# Patient Record
Sex: Female | Born: 1975 | Race: White | Hispanic: No | Marital: Married | Smoking: Former smoker
Health system: Southern US, Community
[De-identification: ages and names within clinical notes are randomized; demographics above are authoritative.]

## PROBLEM LIST (undated history)

## (undated) DIAGNOSIS — K219 Gastro-esophageal reflux disease without esophagitis: Secondary | ICD-10-CM

## (undated) HISTORY — PX: WISDOM TOOTH EXTRACTION: SHX21

---

## 2006-07-28 ENCOUNTER — Ambulatory Visit: Payer: Self-pay | Admitting: Internal Medicine

## 2006-09-11 ENCOUNTER — Ambulatory Visit: Payer: Self-pay | Admitting: Internal Medicine

## 2006-09-13 ENCOUNTER — Ambulatory Visit: Payer: Self-pay | Admitting: Internal Medicine

## 2008-06-11 ENCOUNTER — Inpatient Hospital Stay (HOSPITAL_COMMUNITY): Admission: AD | Admit: 2008-06-11 | Discharge: 2008-06-15 | Payer: Self-pay | Admitting: Obstetrics and Gynecology

## 2010-11-23 NOTE — Op Note (Signed)
NAME:  Tina Sawyer, Tina Sawyer               ACCOUNT NO.:  [id number]   MEDICAL RECORD NO.:  [id number]          PATIENT TYPE:  INP   LOCATION:  9135                          FACILITY:  [facility]   PHYSICIAN:  Maxie Better, M.D.DATE OF BIRTH:  October 30, 1975   DATE OF PROCEDURE:  06/12/2008  DATE OF DISCHARGE:                               OPERATIVE REPORT   PREOPERATIVE DIAGNOSES:  1. Breech presentation with spontaneous rupture of membranes.  2. Early labor.  3. Term gestation.   PROCEDURES:  1. Primary cesarean section,  Kerr hysterotomy.   POSTOPERATIVE DIAGNOSES:  1. Incomplete breech presentation.  2. Term gestation.  3. Early labor.  4. Spontaneous rupture of membranes.   ANESTHESIA:  Spinal.   SURGEON:  Maxie Better, MD   ASSISTANT:  Marlinda Mike, CNM   PROCEDURE:  Under adequate spinal anesthesia, the patient was placed in  the supine position in a left lateral tilt.  She was sterilely prepped  and draped in usual fashion.  An indwelling Foley catheter was sterilely  placed.  Marcaine 0.25% was then injected along the planned Pfannenstiel  skin incision site.  A Pfannenstiel skin incision was then made, carried  down to the rectus fascia.  The rectus fascia was opened transversely.  The rectus fascia was then bluntly and sharply dissected off the rectus  muscle in superior and inferior fashion.  The rectus muscle was split in  midline.  The parietal peritoneum was opened bluntly and extended.  The  vesicouterine peritoneum was opened transversely and the bladder bluntly  dissected off the lower uterine segment and displaced with a bladder  retractor.  A curvilinear low-transverse uterine incision was then made  and extended with bandage scissors.  Subsequent delivery of a live female  from incomplete procedure using breech maneuvers was accomplished.  Cord  was draped around the neck and along the anterior chest.  This was  reduced after the delivery of the baby.  Baby was  bulb suctioned in the  abdomen cord was clamped and cut.  There was transferred to the awaiting  pediatrician.  Assigned Apgars of 8 and 9 at 1 and 5 minutes.  Placenta  was manually removed.  The uterine cavity was cleaned of debris.  The  uterine incision had no extension, was closed in 2 layers, the first  layer with 0 Monocryl running lock stitch, second layer imbricating  using 0 Monocryl suture.  Normal tubes and ovaries were noted  bilaterally.  The abdomen was copiously irrigated and suctioned of  debris.  The parietal peritoneum was closed with 2-0 Vicryl.  The rectus  fascia was closed with 0 Vicryl x2.  The subcutaneous areas were  irrigated.  Small bleeders cauterized.  Interrupted 2-0 plain sutures  were placed and the skin approximated with Ethicon staples.   SPECIMENS:  Placenta not sent to pathology.   ESTIMATED BLOOD LOSS:  700 mL.   INTRAOPERATIVE FLUID:  800 mL.   URINE OUTPUT:  200 mL.   COUNTS:  Sponge and instrument counts x2 was correct.   COMPLICATIONS:  None.   Weight of the baby  was 7 pounds 9 ounces.  The patient tolerated  procedure well and was transferred to recovery room in stable condition.      Maxie Better, M.D.  Electronically Signed     [place]/MEDQ  D:  06/12/2008  T:  06/13/2008  Job:  [id number]

## 2010-11-26 NOTE — Assessment & Plan Note (Signed)
[facility] OFFICE NOTE   Tina Sawyer, Tina Sawyer                        MRN:          [id number]  DATE:07/28/2006                            DOB:          July 10, 1976    CHIEF COMPLAINT:  New patient to get established, had problem with  scoliosis, back pain, urinary symptoms, question UTI.   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  She is originally from  [place] and needs a primary care physician.  She has had scoliosis,  diagnosed since about age 79, and has been followed by an orthopedist.  However, around college age she was lost to followup but had doing quite  well and needed no surgery or bracing.  However, most recently over the  last year or so she has had some difficulty with right upper back pain  in the periscapular area.  She is right-handed but has no specific  injury.  She has no neurologic symptoms or weakness.  Her pain is  problematic, but is more concerned that it needs some kind of followup.  She also has had 3-week history of UTI symptoms which she describes as  frequency, pressure without dysuria.  She has had a history of UTIs in  the past, but has not had one for quite a while.  She does take some  caffeine on a daily basis and occasional alcohol, but otherwise no  herbals or medications.  She has been on OCPs in the remote past.  She  ran out in December 2007, and because she had been on OCPs since age 68  is considering stopping OCPs for now.   PAST MEDICAL HISTORY:  See database.  UTIs.  SURGERIES:  None.  Some  history of migraine.  She is primiparous.  Last Pap in December 2006.  LMP November 15.  Tetanus shot 2005.   FAMILY HISTORY:  Positive for mother with arthritis and parents with  hypertension and diabetes.  Mom and sister may have thyroid  disease.   SOCIAL HISTORY:  Household of 2, is married, 6 to 8 hours of sleep a  night, 3 to 4 glasses of wine a week, caffeine 1 to 2 servings a day, no  current alcohol use.  Occasionally does drink emergency C powder.  Exercise, walks dog and does yoga.   REVIEW OF SYSTEMS:  Negative for chest pain, shortness of breath,  neurologic symptoms except sometimes numbness in the left lateral foot  at the toes, but no other leg symptoms.   OBJECTIVE:  Height 5 feet 2-1/2 inches, weight 111.  Pulse 72 and  regular, blood pressure 130/86.  This is a WD, WN, healthy-appearing young adult in no acute distress.  HEENT:  Generally normal.  CHEST:  CTA, BS equal.  CARDIAC:  S1, S2, no gallops or murmurs.  Peripheral pulses present.  ABDOMEN:  Soft without organomegaly, guarding or rebound.  BACK:  Does show some scoliosis of the  right upper scapular rib without  obvious motor difficulties.  NEUROLOGIC:  Grossly intact.   Urinalysis is clear.   IMPRESSION:  1. Right periscapular pain.  2. Scoliosis.  3. Urinary frequency, newer onset.  This may be noninfectious, I will      check urine culture and sensitivity, treat as appropriate.      Discussed caffeine and other bladder irritants.  We will do      referral in regard to her scoliosis and upper back pain and she      will get back to [place] as needed otherwise.  We did discuss OCPs if      needed.  She will be probably go to a GYN locally if she is      considering conception.     Neta Mends. Panosh, MD  Electronically Signed    [facility]/MedQ  DD: 07/28/2006  DT: 07/29/2006  Job #: [id number]

## 2010-11-26 NOTE — Discharge Summary (Signed)
NAME:  Tina Sawyer, Tina Sawyer               ACCOUNT NO.:  [id number]   MEDICAL RECORD NO.:  [id number]          PATIENT TYPE:  INP   LOCATION:  9135                          [facility]ACILITY:  [facility]   PHYSICIAN:  Maxie Better, M.D.DATE OF BIRTH:  03-20-1976   DATE OF ADMISSION:  06/11/2008  DATE OF DISCHARGE:  06/15/2008                               DISCHARGE SUMMARY   ADMISSION DIAGNOSES:  Early labor, spontaneous rupture of membranes, and  intrauterine gestation at 65 weeks.   DISCHARGE DIAGNOSES:  Term gestation delivered, breech presentation,  spontaneous rupture of membranes, and early labor.   PROCEDURE:  Primary cesarean section.   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  The patient desired natural childbirth.  She  was examined in the Maternity Admissions by the nurse and was found to  be 1 cm, 40-50% effaced, -2 station.  Light meconium stage fluid was  noted.  Fern positive.   HOSPITAL COURSE:  The patient was admitted to the Labor Unit and was  ambulating.  She had intermittent external fetal monitoring.  The  monitoring in the maternity admissions had shown reactivity with  contractions every 4-5 minutes.  The patient was examined at 5:15 in the  morning.  She was 3-4, 90%.  There was a question of breech presentation  with clear amniotic fluid.  Ultrasound was performed that confirmed  there was a breech presentation in light of the spontaneous rupture of  membranes and the presentation.  The patient was certainly not a  candidate for external cephalic version and was informed of the need for  primary cesarean section.  Surgical risk was reviewed.  The patient was  taken to the operating room.  She underwent a cesarean section which  resulted in delivery of 7-pound 9-ounce live female with cord around the  neck and chest.  Normal tubes and ovaries.   Apgars of 9 and 9.  The  placenta was not sent.  It was incomplete breech.  The patient had a  subsequent uncomplicated postoperative course.  She had a CBC on postop  day #1 that showed hemoglobin 11.2, hematocrit 33, white count of 10.2,  and platelet count 174,000.  By postop day #3, the patient had no  evidence of an infection, tolerating a regular diet, had some slight  bruising over the lower incision, but otherwise unremarkable.  She was  deemed well to be discharged home.   DISPOSITION:  Home.   CONDITION:  Stable.   DISCHARGE MEDICATIONS:  1. Percocet 1-2 tablets every 4-6 hours p.r.n. pain.  2. Mylicon 80 mg p.o. t.i.d. p.r.n.  3. Motrin 600-800 mg every 6-8 hours p.r.n. pain.  4. Prenatal vitamins 1 p.o.   DISCHARGE INSTRUCTIONS:  Per the postpartum booklet given.   FOLLOWUP APPOINTMENT:  At [facility] in 6 weeks.      Maxie Better, M.D.  Electronically Signed     /MEDQ  D:  07/27/2008  T:  07/27/2008  Job:  [id number]

## 2011-03-02 LAB — RPR: RPR: NONREACTIVE

## 2011-03-02 LAB — HEPATITIS B SURFACE ANTIGEN: Hepatitis B Surface Ag: NEGATIVE

## 2011-03-02 LAB — ANTIBODY SCREEN: Antibody Screen: NEGATIVE

## 2011-04-15 LAB — CBC
HCT: 33 % — ABNORMAL LOW (ref 36.0–46.0)
HCT: 38.7 % (ref 36.0–46.0)
Hemoglobin: 13 g/dL (ref 12.0–15.0)
MCHC: 33.7 g/dL (ref 30.0–36.0)
MCHC: 34 g/dL (ref 30.0–36.0)
MCV: 94.8 fL (ref 78.0–100.0)
Platelets: 174 10*3/uL (ref 150–400)
RBC: 4.13 MIL/uL (ref 3.87–5.11)
RDW: 13.1 % (ref 11.5–15.5)
WBC: 10.2 10*3/uL (ref 4.0–10.5)

## 2011-05-15 ENCOUNTER — Encounter (HOSPITAL_COMMUNITY): Payer: Self-pay

## 2011-05-16 ENCOUNTER — Other Ambulatory Visit: Payer: Self-pay | Admitting: Obstetrics & Gynecology

## 2011-05-17 ENCOUNTER — Encounter (HOSPITAL_COMMUNITY): Payer: Self-pay | Admitting: *Deleted

## 2011-05-18 ENCOUNTER — Encounter (HOSPITAL_COMMUNITY)
Admission: RE | Admit: 2011-05-18 | Discharge: 2011-05-18 | Disposition: A | Discharge status: Home / Self Care | Payer: BC Managed Care – PPO | Source: Ambulatory Visit | Attending: Obstetrics & Gynecology | Admitting: Obstetrics & Gynecology

## 2011-05-18 ENCOUNTER — Encounter (HOSPITAL_COMMUNITY): Payer: Self-pay

## 2011-05-18 HISTORY — DX: Gastro-esophageal reflux disease without esophagitis: K21.9

## 2011-05-18 LAB — CBC
HCT: 39.2 % (ref 36.0–46.0)
Hemoglobin: 12.9 g/dL (ref 12.0–15.0)
RBC: 4.19 MIL/uL (ref 3.87–5.11)

## 2011-05-18 LAB — RPR: RPR Ser Ql: NONREACTIVE

## 2011-05-18 LAB — SURGICAL PCR SCREEN: MRSA, PCR: NEGATIVE

## 2011-05-18 NOTE — Patient Instructions (Addendum)
   Your procedure is scheduled XL:[id number] November 9th  Enter through the Main Entrance of [facility] at:7:30am Pick up the phone at the desk and dial [phone] and inform us of your arrival.  Please call this number if you have any problems the morning of surgery: [phone]  Remember: Do not eat food after midnight:Thursday Do not drink clear liquids after:midnight thursday Take these medicines the morning of surgery with a SIP OF WATER:none  Do not wear jewelry, make-up, or FINGER nail polish Do not wear lotions, powders, or perfumes.  You may not  wear deodorant. Do not shave 48 hours prior to surgery. Do not bring valuables to the hospital.  Leave suitcase in the car. After Surgery it may be brought to your room. For patients being admitted to the hospital, checkout time is 11:00am the day of discharge.   Remember to use your hibiclens as instructed.Please shower with 1/2 bottle the evening before your surgery and the other 1/2 bottle the morning of surgery.

## 2011-05-19 MED ORDER — GENTAMICIN SULFATE 40 MG/ML IJ SOLN
Freq: Once | INTRAMUSCULAR | Status: DC
  Filled 2011-05-19: qty 2.5

## 2011-05-19 MED ORDER — GENTAMICIN SULFATE 40 MG/ML IJ SOLN: INTRAMUSCULAR | Status: DC

## 2011-05-20 ENCOUNTER — Encounter (HOSPITAL_COMMUNITY): Payer: Self-pay | Admitting: Cardiology

## 2011-05-20 ENCOUNTER — Inpatient Hospital Stay (HOSPITAL_COMMUNITY)
Admission: AD | Admit: 2011-05-20 | Discharge: 2011-05-22 | DRG: 371 | Disposition: A | Discharge status: Home / Self Care | Payer: BC Managed Care – PPO | Source: Ambulatory Visit | Attending: Obstetrics & Gynecology | Admitting: Obstetrics & Gynecology

## 2011-05-20 ENCOUNTER — Encounter (HOSPITAL_COMMUNITY): Payer: Self-pay | Admitting: Anesthesiology

## 2011-05-20 ENCOUNTER — Inpatient Hospital Stay (HOSPITAL_COMMUNITY): Payer: BC Managed Care – PPO | Admitting: Anesthesiology

## 2011-05-20 ENCOUNTER — Encounter (HOSPITAL_COMMUNITY): Payer: Self-pay

## 2011-05-20 ENCOUNTER — Encounter (HOSPITAL_COMMUNITY)
Admission: AD | Disposition: A | Discharge status: Home / Self Care | Payer: Self-pay | Source: Ambulatory Visit | Attending: Obstetrics & Gynecology

## 2011-05-20 DIAGNOSIS — Z01818 Encounter for other preprocedural examination: Secondary | ICD-10-CM

## 2011-05-20 DIAGNOSIS — O34219 Maternal care for unspecified type scar from previous cesarean delivery: Principal | ICD-10-CM | POA: Diagnosis present

## 2011-05-20 DIAGNOSIS — Z01812 Encounter for preprocedural laboratory examination: Secondary | ICD-10-CM

## 2011-05-20 LAB — ABO/RH: ABO/RH(D): A POS

## 2011-05-20 SURGERY — Surgical Case
Anesthesia: Spinal

## 2011-05-20 MED ORDER — ONDANSETRON HCL 4 MG/2ML IJ SOLN
INTRAMUSCULAR | Status: DC | PRN
  Administered 2011-05-20: 4 mg via INTRAVENOUS

## 2011-05-20 MED ORDER — METOCLOPRAMIDE HCL 5 MG/ML IJ SOLN: 10.0000 mg | Freq: Three times a day (TID) | INTRAMUSCULAR | Status: DC | PRN

## 2011-05-20 MED ORDER — SODIUM CHLORIDE 0.9 % IJ SOLN: 3.0000 mL | INTRAMUSCULAR | Status: DC | PRN

## 2011-05-20 MED ORDER — LACTATED RINGERS IV SOLN
INTRAVENOUS | Status: DC | PRN
  Administered 2011-05-20 (×4): via INTRAVENOUS

## 2011-05-20 MED ORDER — SCOPOLAMINE 1 MG/3DAYS TD PT72
MEDICATED_PATCH | TRANSDERMAL | Status: AC
  Administered 2011-05-20: 1.5 mg via TRANSDERMAL
  Filled 2011-05-20: qty 1

## 2011-05-20 MED ORDER — DIPHENHYDRAMINE HCL 50 MG/ML IJ SOLN: 12.5000 mg | INTRAMUSCULAR | Status: DC | PRN

## 2011-05-20 MED ORDER — EPHEDRINE SULFATE 50 MG/ML IJ SOLN
INTRAMUSCULAR | Status: DC | PRN
  Administered 2011-05-20 (×2): 10 mg via INTRAVENOUS
  Administered 2011-05-20 (×2): 5 mg via INTRAVENOUS

## 2011-05-20 MED ORDER — GENTAMICIN SULFATE 40 MG/ML IJ SOLN
INTRAVENOUS | Status: DC | PRN
  Administered 2011-05-20: 100 mL via INTRAVENOUS

## 2011-05-20 MED ORDER — HYDROMORPHONE HCL PF 1 MG/ML IJ SOLN: 0.2500 mg | INTRAMUSCULAR | Status: DC | PRN

## 2011-05-20 MED ORDER — SCOPOLAMINE 1 MG/3DAYS TD PT72
1.0000 | MEDICATED_PATCH | Freq: Once | TRANSDERMAL | Status: DC
  Filled 2011-05-20: qty 1

## 2011-05-20 MED ORDER — IBUPROFEN 600 MG PO TABS
600.0000 mg | ORAL_TABLET | Freq: Four times a day (QID) | ORAL | Status: DC
  Administered 2011-05-21 – 2011-05-22 (×5): 600 mg via ORAL
  Filled 2011-05-20 (×4): qty 1

## 2011-05-20 MED ORDER — PRENATAL PLUS 27-1 MG PO TABS
1.0000 | ORAL_TABLET | Freq: Every day | ORAL | Status: DC
  Administered 2011-05-21 – 2011-05-22 (×2): 1 via ORAL
  Filled 2011-05-20 (×2): qty 1

## 2011-05-20 MED ORDER — SIMETHICONE 80 MG PO CHEW
80.0000 mg | CHEWABLE_TABLET | Freq: Three times a day (TID) | ORAL | Status: DC
  Administered 2011-05-20 – 2011-05-22 (×5): 80 mg via ORAL

## 2011-05-20 MED ORDER — FENTANYL CITRATE 0.05 MG/ML IJ SOLN: 25.0000 ug | INTRAMUSCULAR | Status: DC | PRN

## 2011-05-20 MED ORDER — OXYTOCIN 20 UNITS IN LACTATED RINGERS INFUSION - SIMPLE: 125.0000 mL/h | INTRAVENOUS | Status: AC

## 2011-05-20 MED ORDER — NALBUPHINE HCL 10 MG/ML IJ SOLN
5.0000 mg | INTRAMUSCULAR | Status: DC | PRN
  Filled 2011-05-20: qty 1

## 2011-05-20 MED ORDER — SIMETHICONE 80 MG PO CHEW: 80.0000 mg | CHEWABLE_TABLET | ORAL | Status: DC | PRN

## 2011-05-20 MED ORDER — IBUPROFEN 600 MG PO TABS: 600.0000 mg | ORAL_TABLET | Freq: Four times a day (QID) | ORAL | Status: DC | PRN

## 2011-05-20 MED ORDER — MORPHINE SULFATE (PF) 0.5 MG/ML IJ SOLN
INTRAMUSCULAR | Status: DC | PRN
  Administered 2011-05-20: .1 mg via INTRATHECAL

## 2011-05-20 MED ORDER — SODIUM CHLORIDE 0.9 % IV SOLN
1.0000 ug/kg/h | INTRAVENOUS | Status: DC | PRN
  Filled 2011-05-20: qty 2.5

## 2011-05-20 MED ORDER — MEPERIDINE HCL 25 MG/ML IJ SOLN: 6.2500 mg | INTRAMUSCULAR | Status: DC | PRN

## 2011-05-20 MED ORDER — NALOXONE HCL 0.4 MG/ML IJ SOLN: 0.4000 mg | INTRAMUSCULAR | Status: DC | PRN

## 2011-05-20 MED ORDER — BUPIVACAINE IN DEXTROSE 0.75-8.25 % IT SOLN
INTRATHECAL | Status: DC | PRN
  Administered 2011-05-20: 11.75 mg via INTRATHECAL

## 2011-05-20 MED ORDER — DIPHENHYDRAMINE HCL 25 MG PO CAPS: 25.0000 mg | ORAL_CAPSULE | ORAL | Status: DC | PRN

## 2011-05-20 MED ORDER — ONDANSETRON HCL 4 MG/2ML IJ SOLN: 4.0000 mg | INTRAMUSCULAR | Status: DC | PRN

## 2011-05-20 MED ORDER — TETANUS-DIPHTH-ACELL PERTUSSIS 5-2.5-18.5 LF-MCG/0.5 IM SUSP: 0.5000 mL | Freq: Once | INTRAMUSCULAR | Status: DC

## 2011-05-20 MED ORDER — HYDROMORPHONE HCL PF 1 MG/ML IJ SOLN
INTRAMUSCULAR | Status: AC
  Filled 2011-05-20: qty 1

## 2011-05-20 MED ORDER — HYDROMORPHONE HCL PF 1 MG/ML IJ SOLN
0.2500 mg | INTRAMUSCULAR | Status: DC | PRN
  Administered 2011-05-20: 0.25 mg via INTRAVENOUS

## 2011-05-20 MED ORDER — LACTATED RINGERS IV SOLN
INTRAVENOUS | Status: DC
  Administered 2011-05-20: 22:00:00 via INTRAVENOUS

## 2011-05-20 MED ORDER — LANOLIN HYDROUS EX OINT: 1.0000 "application " | TOPICAL_OINTMENT | CUTANEOUS | Status: DC | PRN

## 2011-05-20 MED ORDER — OXYTOCIN 20 UNITS IN LACTATED RINGERS INFUSION - SIMPLE
INTRAVENOUS | Status: DC | PRN
  Administered 2011-05-20 (×2): 20 [IU] via INTRAVENOUS

## 2011-05-20 MED ORDER — DIBUCAINE 1 % RE OINT: 1.0000 "application " | TOPICAL_OINTMENT | RECTAL | Status: DC | PRN

## 2011-05-20 MED ORDER — PHENYLEPHRINE HCL 10 MG/ML IJ SOLN
INTRAMUSCULAR | Status: DC | PRN
  Administered 2011-05-20 (×4): 40 ug via INTRAVENOUS

## 2011-05-20 MED ORDER — SCOPOLAMINE 1 MG/3DAYS TD PT72
1.0000 | MEDICATED_PATCH | Freq: Once | TRANSDERMAL | Status: DC
  Administered 2011-05-20: 1.5 mg via TRANSDERMAL

## 2011-05-20 MED ORDER — KETOROLAC TROMETHAMINE 60 MG/2ML IM SOLN
60.0000 mg | Freq: Once | INTRAMUSCULAR | Status: AC | PRN
  Administered 2011-05-20: 60 mg via INTRAMUSCULAR

## 2011-05-20 MED ORDER — DIPHENHYDRAMINE HCL 50 MG/ML IJ SOLN: 25.0000 mg | INTRAMUSCULAR | Status: DC | PRN

## 2011-05-20 MED ORDER — WITCH HAZEL-GLYCERIN EX PADS: 1.0000 "application " | MEDICATED_PAD | CUTANEOUS | Status: DC | PRN

## 2011-05-20 MED ORDER — ONDANSETRON HCL 4 MG/2ML IJ SOLN: 4.0000 mg | Freq: Three times a day (TID) | INTRAMUSCULAR | Status: DC | PRN

## 2011-05-20 MED ORDER — DIPHENHYDRAMINE HCL 25 MG PO CAPS: 25.0000 mg | ORAL_CAPSULE | Freq: Four times a day (QID) | ORAL | Status: DC | PRN

## 2011-05-20 MED ORDER — MENTHOL 3 MG MT LOZG: 1.0000 | LOZENGE | OROMUCOSAL | Status: DC | PRN

## 2011-05-20 MED ORDER — LACTATED RINGERS IV SOLN
INTRAVENOUS | Status: DC
  Administered 2011-05-20: 08:00:00 via INTRAVENOUS

## 2011-05-20 MED ORDER — OXYCODONE-ACETAMINOPHEN 5-325 MG PO TABS
1.0000 | ORAL_TABLET | ORAL | Status: DC | PRN
  Administered 2011-05-21: 1 via ORAL
  Administered 2011-05-21: 2 via ORAL
  Administered 2011-05-21 – 2011-05-22 (×6): 1 via ORAL
  Filled 2011-05-20: qty 2
  Filled 2011-05-20 (×5): qty 1
  Filled 2011-05-20: qty 2
  Filled 2011-05-20: qty 1

## 2011-05-20 MED ORDER — MEPERIDINE HCL 25 MG/ML IJ SOLN
6.2500 mg | INTRAMUSCULAR | Status: DC | PRN
Start: 2011-05-20 — End: 2011-05-20

## 2011-05-20 MED ORDER — FENTANYL CITRATE 0.05 MG/ML IJ SOLN
INTRAMUSCULAR | Status: DC | PRN
  Administered 2011-05-20: 15 ug via INTRATHECAL

## 2011-05-20 MED ORDER — ONDANSETRON HCL 4 MG PO TABS: 4.0000 mg | ORAL_TABLET | ORAL | Status: DC | PRN

## 2011-05-20 MED ORDER — KETOROLAC TROMETHAMINE 30 MG/ML IJ SOLN: 30.0000 mg | Freq: Four times a day (QID) | INTRAMUSCULAR | Status: AC | PRN

## 2011-05-20 MED ORDER — SENNOSIDES-DOCUSATE SODIUM 8.6-50 MG PO TABS
2.0000 | ORAL_TABLET | Freq: Every day | ORAL | Status: DC
  Administered 2011-05-20 – 2011-05-21 (×2): 2 via ORAL

## 2011-05-20 MED ORDER — ZOLPIDEM TARTRATE 5 MG PO TABS: 5.0000 mg | ORAL_TABLET | Freq: Every evening | ORAL | Status: DC | PRN

## 2011-05-20 MED ORDER — KETOROLAC TROMETHAMINE 30 MG/ML IJ SOLN
30.0000 mg | Freq: Four times a day (QID) | INTRAMUSCULAR | Status: AC | PRN
  Administered 2011-05-20 – 2011-05-21 (×3): 30 mg via INTRAVENOUS
  Filled 2011-05-20 (×3): qty 1

## 2011-05-20 SURGICAL SUPPLY — 40 items
BENZOIN TINCTURE PRP APPL 2/3 (GAUZE/BANDAGES/DRESSINGS) ×2 IMPLANT
CHLORAPREP W/TINT 26ML (MISCELLANEOUS) ×2 IMPLANT
CLOTH BEACON ORANGE TIMEOUT ST (SAFETY) ×2 IMPLANT
CONTAINER PREFILL 10% NBF 15ML (MISCELLANEOUS) IMPLANT
DRESSING TELFA 8X3 (GAUZE/BANDAGES/DRESSINGS) ×6 IMPLANT
DRSG PAD ABDOMINAL 8X10 ST (GAUZE/BANDAGES/DRESSINGS) ×2 IMPLANT
ELECT REM PT RETURN 9FT ADLT (ELECTROSURGICAL) ×2
ELECTRODE REM PT RTRN 9FT ADLT (ELECTROSURGICAL) ×1 IMPLANT
EXTRACTOR VACUUM KIWI (MISCELLANEOUS) IMPLANT
EXTRACTOR VACUUM M CUP 4 TUBE (SUCTIONS) IMPLANT
GAUZE SPONGE 4X4 12PLY STRL LF (GAUZE/BANDAGES/DRESSINGS) ×4 IMPLANT
GLOVE BIO SURGEON STRL SZ7 (GLOVE) ×2 IMPLANT
GLOVE BIOGEL PI IND STRL 7.0 (GLOVE) ×2 IMPLANT
GLOVE BIOGEL PI INDICATOR 7.0 (GLOVE) ×2
GOWN PREVENTION PLUS LG XLONG (DISPOSABLE) ×6 IMPLANT
KIT ABG SYR 3ML LUER SLIP (SYRINGE) IMPLANT
NEEDLE HYPO 25X5/8 SAFETYGLIDE (NEEDLE) IMPLANT
NS IRRIG 1000ML POUR BTL (IV SOLUTION) ×2 IMPLANT
PACK C SECTION WH (CUSTOM PROCEDURE TRAY) ×2 IMPLANT
PAD ABD 7.5X8 STRL (GAUZE/BANDAGES/DRESSINGS) ×4 IMPLANT
RTRCTR C-SECT PINK 25CM LRG (MISCELLANEOUS) IMPLANT
SLEEVE SCD COMPRESS KNEE MED (MISCELLANEOUS) IMPLANT
SPONGE GAUZE 4X4 12PLY (GAUZE/BANDAGES/DRESSINGS) ×2 IMPLANT
STAPLER VISISTAT 35W (STAPLE) IMPLANT
STRIP CLOSURE SKIN 1/2X4 (GAUZE/BANDAGES/DRESSINGS) ×4 IMPLANT
STRIP CLOSURE SKIN 1/4X4 (GAUZE/BANDAGES/DRESSINGS) IMPLANT
SUT MNCRL 0 VIOLET CTX 36 (SUTURE) ×3 IMPLANT
SUT MONOCRYL 0 CTX 36 (SUTURE) ×3
SUT PLAIN 0 NONE (SUTURE) IMPLANT
SUT PLAIN 2 0 (SUTURE)
SUT PLAIN ABS 2-0 CT1 27XMFL (SUTURE) IMPLANT
SUT VIC AB 0 CT1 27 (SUTURE) ×2
SUT VIC AB 0 CT1 27XBRD ANBCTR (SUTURE) ×2 IMPLANT
SUT VIC AB 2-0 CT1 27 (SUTURE) ×2
SUT VIC AB 2-0 CT1 TAPERPNT 27 (SUTURE) ×2 IMPLANT
SUT VIC AB 4-0 KS 27 (SUTURE) ×2 IMPLANT
SUT VICRYL 0 TIES 12 18 (SUTURE) IMPLANT
TOWEL OR 17X24 6PK STRL BLUE (TOWEL DISPOSABLE) ×4 IMPLANT
TRAY FOLEY CATH 14FR (SET/KITS/TRAYS/PACK) IMPLANT
WATER STERILE IRR 1000ML POUR (IV SOLUTION) ×2 IMPLANT

## 2011-05-20 NOTE — Transfer of Care (Signed)
Immediate Anesthesia Transfer of Care Note  Patient: Tina Sawyer  Procedure(s) Performed:  CESAREAN SECTION - Repeat  Patient Location: PACU  Anesthesia Type: Spinal  Level of Consciousness: awake, alert  and oriented  Airway & Oxygen Therapy: Patient Spontanous Breathing  Post-op Assessment: Report given to PACU RN and Post -op Vital signs reviewed and stable  Post vital signs: Reviewed and stable  Complications: No apparent anesthesia complications

## 2011-05-20 NOTE — H&P (Signed)
Jeananne Bedwell is a 35 y.o. female presenting for repeat cesarean section. No complaints. Healthy, uncomplicated pregnancy. Nl labs and sono. [facility] [place] Marlinda Mike CNM.  Allergy - Amox  History OB History    Grav Para Term Preterm Abortions TAB SAB Ect Mult Living   3 1 1  1  1   1      Past Medical History  Diagnosis Date  . Asthma     as child-exercise induced  . GERD (gastroesophageal reflux disease)     with pregnancy-no meds   Past Surgical History  Procedure Date  . Cesarean section 2009  . Wisdom tooth extraction    Family History: family history is not on file. Social History:  reports that she quit smoking about 10 years ago. She does not have any smokeless tobacco history on file. She reports that she drinks alcohol. She reports that she does not use illicit drugs.  ROS negative all.     Blood pressure 117/65, pulse 78, temperature 98.4 F (36.9 C), temperature source Oral, resp. rate 16, SpO2 98.00%. Exam Physical Exam  A&O x 3, no acute distress. Pleasant HEENT neg, no thyromegaly Lungs CTA bilat CV RRR, S1S2 normal Abdo soft, non tender, non acute Extr no edema/ tenderness Pelvic derered FHT  140s.  Toco no palpable contractions  Prenatal labs: ABO, Rh: A/Positive/-- (08/22 0000) Antibody: Negative (08/22 0000) Rubella:  Immune RPR: NON REACTIVE (11/07 0908)  HBsAg: Negative (08/22 0000)  HIV: Non-reactive (08/22 0000)  GBS:   Negative.  1 hr abn, 3 hr GTT normal.  Ob sono - normal.   Assessment/Plan: 39.2/7 wks, prior LTCS, here for repeat cesarean section. Risks/complications reviewed, informed written consent. Agrees.     Dasan Hardman R 05/20/2011, 8:46 AM

## 2011-05-20 NOTE — Anesthesia Procedure Notes (Signed)
Spinal  Patient location during procedure: OR Start time: 05/20/2011 9:10 AM Staffing Performed by: anesthesiologist  Preanesthetic Checklist Completed: patient identified, site marked, surgical consent, pre-op evaluation, timeout performed, IV checked, risks and benefits discussed and monitors and equipment checked Spinal Block Patient position: sitting Prep: site prepped and draped and DuraPrep Patient monitoring: heart rate, cardiac monitor, continuous pulse ox and blood pressure Approach: midline Location: L3-4 Injection technique: single-shot Needle Needle type: Sprotte  Needle gauge: 24 G Needle length: 9 cm Assessment Sensory level: T4 Additional Notes Clear free flow CSF on first attempt.  Significant scoliosis noted.  Jasmine December, MD

## 2011-05-20 NOTE — Op Note (Signed)
Charlott Rakes  Repeat Cesarean Section Procedure Note   05/20/2011  Pre-operative Diagnosis: Previous C-Section, desired elective repeat cesarean section, 39.2/7 wks.  Post-operative Diagnosis: Same Surgeon: Surgeon(s) and Role:  Robley Fries - Primary  Assistants: Marlinda Mike, CNM Anesthesia: Spinal    Procedure Details:  The patient was seen in the Holding Room. The risks, benefits, complications, treatment options, and expected outcomes were discussed with the patient. The patient concurred with the proposed plan, giving informed consent. identified as Charlott Rakes and the procedure verified as Repeat Cesarean Section Delivery. She was brought to OR with IV running and received Gentamicin and Clindamycin for prophylaxis.  A Time Out was held and the above information confirmed. Spinal anesthesia was adequate. Patient was prepped, draped after inserting foley in sterile fashion. A Pfannenstiel incision was made and carried down through the subcutaneous tissue to the fascia. Fascial incision was made and extended transversely. The fascia was separated from the underlying rectus tissue superiorly and inferiorly. The peritoneum was identified and entered. Peritoneal incision was extended longitudinally. The utero-vesical peritoneal reflection was incised transversely and the bladder flap was bluntly freed from the lower uterine segment. A low transverse uterine incision was made. Delivered from Cephalic presentation was a viable Female infant, weight 7 lb 2.8 oz at 9.29 am, with Apgar scores of 9 and 9. Nuchal cord x 2 noted, was reduced after head delivery. Cord clamped and cut, baby handed to NICU team. Cord blood donation desired. Cord gas not needed. Placenta was removed Intact and appeared normal. The uterine outline, tubes and ovaries appeared normal}. The uterine incision was closed in 2 layers with running locked sutures of 0Vicryl.   Hemostasis was observed. Lavage was carried out until  clear. Peritoneum closed with 2-0 vicryl.  The fascia was then reapproximated with running sutures of 0Vicryl. The subcuticular closure was performed using 3-0Vicryl. Steristrips and pressure dressing applied.  Instrument, sponge, and needle counts were correct prior the abdominal closure and were correct at the conclusion of the case.   Findings: Female infant, born at 9.29 am, cephalic delivery. Nuchal cord x 2, loose. Apgars 9/ 9. Weight 7lb 2.8 oz. Bilateral tubes, ovaries normal, no intrabdominal adhesions noted.    Estimated Blood Loss: 400 cc.  Total IV Fluids: 1700 cc LR Urine Output: 300 cc clear in floey Specimens: Cord blood donation Complications: no complications Disposition: PACU - hemodynamically stable.  Maternal Condition: stable  Baby condition / location:  nursery-stable  V.Juliene Pina, MD Signed: Surgeon(s): Robley Fries

## 2011-05-20 NOTE — Anesthesia Postprocedure Evaluation (Signed)
Anesthesia Post Note  Patient: Tina Sawyer  Procedure(s) Performed:  CESAREAN SECTION - Repeat  Anesthesia type: Spinal  Patient location: PACU  Post pain: Pain level controlled  Post assessment: Post-op Vital signs reviewed  Last Vitals:  Filed Vitals:   05/20/11 1130  BP:   Pulse: 68  Temp:   Resp: 20    Post vital signs: Reviewed  Level of consciousness: awake  Complications: No apparent anesthesia complications

## 2011-05-20 NOTE — Anesthesia Preprocedure Evaluation (Addendum)
Anesthesia Evaluation  Patient identified by MRN, date of birth, ID band Patient awake    Reviewed: Allergy & Precautions, H&P , Patient's Chart, lab work & pertinent test results  Airway Mallampati: II TM Distance: >3 FB Neck ROM: full    Dental No notable dental hx.    Pulmonary  clear to auscultation  Pulmonary exam normal       Cardiovascular Exercise Tolerance: Good regular Normal    Neuro/Psych    GI/Hepatic   Endo/Other    Renal/GU      Musculoskeletal   Abdominal   Peds  Hematology   Anesthesia Other Findings   Reproductive/Obstetrics                           Anesthesia Physical Anesthesia Plan  ASA: II  Anesthesia Plan: Spinal   Post-op Pain Management:    Induction:   Airway Management Planned:   Additional Equipment:   Intra-op Plan:   Post-operative Plan:   Informed Consent: I have reviewed the patients History and Physical, chart, labs and discussed the procedure including the risks, benefits and alternatives for the proposed anesthesia with the patient or authorized representative who has indicated his/her understanding and acceptance.   Dental Advisory Given  Plan Discussed with: CRNA  Anesthesia Plan Comments: (Lab work confirmed.... Platelets okay. Discussed spinal anesthetic, and patient consents to the procedure:  included risk of possible headache,backache, failed block, allergic reaction, and nerve injury. This patient was asked if she had any questions or concerns before the procedure started. )       Anesthesia Quick Evaluation  

## 2011-05-21 LAB — CBC
Platelets: 187 10*3/uL (ref 150–400)
RDW: 13.6 % (ref 11.5–15.5)
WBC: 8.6 10*3/uL (ref 4.0–10.5)

## 2011-05-21 LAB — CCBB MATERNAL DONOR DRAW

## 2011-05-21 NOTE — Addendum Note (Signed)
Addendum  created 05/21/11 0747 by Edison Pace, CRNA   Modules edited:Notes Section

## 2011-05-21 NOTE — Anesthesia Postprocedure Evaluation (Signed)
  Anesthesia Post-op Note  Patient: Tina Sawyer  Procedure(s) Performed:  CESAREAN SECTION - Repeat  Patient Location: Mother/Baby  Anesthesia Type: Spinal  Level of Consciousness: awake  Airway and Oxygen Therapy: Patient Spontanous Breathing  Post-op Assessment: Patient's Cardiovascular Status Stable and Respiratory Function Stable  Post-op Vital Signs: Reviewed and stable  Complications: No apparent anesthesia complications

## 2011-05-21 NOTE — Progress Notes (Signed)
  Subjective: POD# 1 Information for the patient's newborn:  Katoria, Yetman [[id number]]  female   Reports feeling well, considering early D/C Feeding: breast Patient reports tolerating PO.  Breast symptoms:none Pain controlled withprescription NSAID's including motrin and narcotic analgesics including percocet Denies HA/SOB/C/P/N/V/dizziness. Flatus present. She reports vaginal bleeding as normal, without clots.  She is ambulating, urinating without difficult.    Tdap not up to date, no influenza vaccine this season  Objective:   VS: Blood pressure 102/63, pulse 66, temperature 97.8 F (36.6 C), temperature source Oral, resp. rate 18, weight 63.05 kg (139 lb), SpO2 97.00%, unknown if currently breastfeeding.   Intake/Output Summary (Last 24 hours) at 05/21/11 0935 Last data filed at 05/21/11 0500  Gross per 24 hour  Intake   3625 ml  Output   4800 ml  Net  -1175 ml        Basename 05/21/11 0515  WBC 8.6  HGB 11.5*  HCT 34.5*  PLT 187     Blood type: --/--/A POS (11/07 0908)  Rubella:   immune    Physical Exam:  General: alert, cooperative and no distress CV: Regular rate and rhythm Resp: clear Abdomen: soft, nontender, normal bowel sounds Incision: clean, dry, intact and dressing in place Uterine Fundus: firm, below umbilicus, nontender Lochia: minimal Ext: Homans sign is negative, no sign of DVT and no edema, redness or tenderness in the calves or thighs      Assessment/Plan: 35 y.o.  status post Cesarean section. POD# 1.  s/p Cesarean Delivery.  Indications: repeat elective                Active Problems:  Postpartum care following cesarean delivery (11/9)  Doing well, stable.               Advance diet as tolerated Shower and D/C dressing today Ambulate Routine post-op care Recc Tdap and flu shot prior to D/C, considering Anticipate discharge home in AM.  PAUL,DANIELA 05/21/2011, 9:35 AM

## 2011-05-22 MED ORDER — IBUPROFEN 600 MG PO TABS: 600.0000 mg | ORAL_TABLET | Freq: Four times a day (QID) | ORAL | Status: AC | PRN

## 2011-05-22 MED ORDER — OXYCODONE-ACETAMINOPHEN 5-325 MG PO TABS: 1.0000 | ORAL_TABLET | ORAL | Status: AC | PRN

## 2011-05-22 NOTE — Discharge Summary (Signed)
  Obstetric Discharge Summary Reason for Admission: cesarean section and repeat elective Prenatal Procedures: ultrasound Intrapartum Procedures: cesarean: low cervical, transverse Postpartum Procedures: none Complications-Operative and Postpartum: none  Temp:  [97.9 F (36.6 C)-98.6 F (37 C)] 97.9 F (36.6 C) (11/11 0553) Pulse Rate:  [66-98] 98  (11/11 0553) Resp:  [18-20] 20  (11/11 0553) BP: (95-107)/(63-71) 95/64 mmHg (11/11 0553) Hemoglobin  Date Value Range Status  05/21/2011 11.5* 12.0-15.0 (g/dL) Final     HCT  Date Value Range Status  05/21/2011 34.5* 36.0-46.0 (%) Final    Hospital Course:  uneventful  Discharge Diagnoses: Term Pregnancy-delivered  Discharge Information: Date: 05/22/2011 Activity: pelvic rest Diet: routine Medications:  Medication List  As of 05/22/2011 10:01 AM   START taking these medications         ibuprofen 600 MG tablet   Commonly known as: ADVIL,MOTRIN   Take 1 tablet (600 mg total) by mouth every 6 (six) hours as needed for pain.      oxyCODONE-acetaminophen 5-325 MG per tablet   Commonly known as: PERCOCET   Take 1-2 tablets by mouth every 3 (three) hours as needed (moderate - severe pain).         CONTINUE taking these medications         prenatal vitamin w/FE, FA 27-1 MG Tabs          Where to get your medications    These are the prescriptions that you need to pick up.   You may get these medications from any pharmacy.         ibuprofen 600 MG tablet   oxyCODONE-acetaminophen 5-325 MG per tablet           Condition: stable Instructions: refer to practice specific booklet Discharge to: home Follow-up Information    Follow up with BAILEY,TANYA in 6 weeks.   Contact information:   [address] [place] [address] [phone]          Newborn Data: Live born  Information for the patient's newborn:  Nieve, Rojero [[id number]]  female ; APGAR , ; weight ;  Home with  mother.  PAUL,DANIELA 05/22/2011, 10:01 AM

## 2011-05-22 NOTE — Progress Notes (Signed)
Subjective: POD# 2 Information for the patient's newborn:  Pat, Elicker [[id number]]  female   Reports feeling well, desires early d/c. Feeding: breast Patient reports tolerating PO.  Breast symptoms: none Pain controlled withprescription NSAID's including motrin and narcotic analgesics including percocet Denies HA/SOB/C/P/N/V/dizziness. Flatus present. She reports vaginal bleeding as normal, without clots.  She is ambulating, urinating without difficult.     Objective:   VS: Blood pressure 95/64, pulse 98, temperature 97.9 F (36.6 C), temperature source Oral, resp. rate 20, weight 63.05 kg (139 lb), SpO2 97.00%, unknown if currently breastfeeding.   Intake/Output Summary (Last 24 hours) at 05/22/11 0953 Last data filed at 05/21/11 1425  Gross per 24 hour  Intake    480 ml  Output   1250 ml  Net   -770 ml        Basename 05/21/11 0515  WBC 8.6  HGB 11.5*  HCT 34.5*  PLT 187     Blood type: --/--/A POS (11/07 0908)  Rubella:   immune    Physical Exam:  General: alert, cooperative and no distress CV: Regular rate and rhythm Resp: clear Abdomen: soft, nontender, normal bowel sounds Incision: dry, intact, indurated bellow incision line, no erythema or fluctuance, old blood on stristrips, minimal dry soil drainage present Uterine Fundus: firm, below umbilicus, nontender Lochia: minimal Ext: Homans sign is negative, no sign of DVT and no edema, redness or tenderness in the calves or thighs      Assessment/Plan: 35 y.o.  status post Cesarean section. POD# 2.  s/p Cesarean Delivery.  Indications: repeat elective                Active Problems:  Postpartum care following cesarean delivery (11/9)  Doing well, stable.    Routine post-op care D/C home, F/U 6 wks PP  PAUL,DANIELA 05/22/2011, 9:53 AM

## 2011-05-24 ENCOUNTER — Encounter (HOSPITAL_COMMUNITY): Payer: Self-pay | Admitting: Obstetrics & Gynecology

## 2011-06-14 NOTE — Discharge Summary (Signed)
Agree 

## 2013-11-23 ENCOUNTER — Ambulatory Visit (INDEPENDENT_AMBULATORY_CARE_PROVIDER_SITE_OTHER): Payer: Managed Care, Other (non HMO) | Admitting: Internal Medicine

## 2013-11-23 VITALS — BP 118/76 | HR 68 | Temp 98.5°F | Resp 16 | Ht 62.0 in | Wt 101.6 lb

## 2013-11-23 DIAGNOSIS — L259 Unspecified contact dermatitis, unspecified cause: Secondary | ICD-10-CM

## 2013-11-23 MED ORDER — FLUOCINONIDE 0.05 % EX CREA: 1.0000 "application " | TOPICAL_CREAM | Freq: Four times a day (QID) | CUTANEOUS | Status: DC

## 2013-11-23 NOTE — Progress Notes (Signed)
   Subjective:    Patient ID: Charlott RakesBrandi Thune, female    DOB: 01/14/1976, 38 y.o.   MRN: [id number]  This chart was scribed for Ellamae Siaobert Kassie Keng, MD by Bronson CurbJacqueline Melvin, ED Scribe. This patient was seen in room Room/bed 2 and the patient's care was started at 1:03 PM.   HPI  HPI Comments: Charlott RakesBrandi Rettke is a 38 y.o. female who presents to the [facility] for poison ivy. She reports coming into contact with an ivy vine that is located on her fence. There is associated redness, blisters, and discharge on her right arm. Patient is concerned about the rash spreading. She states she was wearing gloves and denies contact with the ivy on other places of her body.     Review of Systems  Constitutional: Negative for fever.  Skin: Positive for rash.  All other systems reviewed and are negative.      Objective:   Physical Exam  Nursing note and vitals reviewed. Constitutional: She is oriented to person, place, and time. She appears well-developed and well-nourished. No distress.  HENT:  Head: Normocephalic and atraumatic.  Eyes: EOM are normal.  Neck: Neck supple.  Cardiovascular: Normal rate.   Pulmonary/Chest: Effort normal. No respiratory distress.  Musculoskeletal: Normal range of motion.  Neurological: She is alert and oriented to person, place, and time.  Skin: Skin is warm and dry.  Patch of vesicular lesions near the antecubital fossa on the right.  Psychiatric: She has a normal mood and affect. Her behavior is normal.          Assessment & Plan:  Contact dermatitis  Meds ordered this encounter  Medications  . fluocinonide cream (LIDEX) 0.05 %    Sig: Apply 1 application topically 4 (four) times daily. As needed until cleared    Dispense:  30 g    Refill:  1   Keep clean  I have completed the patient encounter in its entirety as documented by the scribe, with editing by me where necessary. Letina Luckett P. Merla Richesoolittle, M.D.

## 2015-01-20 ENCOUNTER — Other Ambulatory Visit: Payer: Self-pay | Admitting: Physician Assistant

## 2015-01-20 ENCOUNTER — Ambulatory Visit
Admission: RE | Admit: 2015-01-20 | Discharge: 2015-01-20 | Disposition: A | Discharge status: Home / Self Care | Payer: Managed Care, Other (non HMO) | Source: Ambulatory Visit | Attending: Physician Assistant | Admitting: Physician Assistant

## 2015-01-20 DIAGNOSIS — R079 Chest pain, unspecified: Secondary | ICD-10-CM

## 2015-01-30 DIAGNOSIS — R002 Palpitations: Secondary | ICD-10-CM

## 2015-01-30 NOTE — Progress Notes (Unsigned)
Here for monitor placement from Dr. Lavena Stanford office. Onset of palpitations - has had historically, notes increase in frequency. Denies syncope, CP, SOB. Does note lightheadedness x 1 week ending about 1 week ago.

## 2015-01-30 NOTE — Patient Instructions (Signed)
Cardiac Event Monitoring A cardiac event monitor is a small recording device used to help detect abnormal heart rhythms (arrhythmias). The monitor is used to record heart rhythm when noticeable symptoms such as the following occur:  Fast heartbeats (palpitations), such as heart racing or fluttering.  Dizziness.  Fainting or light-headedness.  Unexplained weakness. The monitor is wired to two electrodes placed on your chest. Electrodes are flat, sticky disks that attach to your skin. The monitor can be worn for up to 30 days. You will wear the monitor at all times, except when bathing.  HOW TO USE YOUR CARDIAC EVENT MONITOR A technician will prepare your chest for the electrode placement. The technician will show you how to place the electrodes, how to work the monitor, and how to replace the batteries. Take time to practice using the monitor before you leave the office. Make sure you understand how to send the information from the monitor to your health care provider. This requires a telephone with a landline, not a cell phone. You need to:  Wear your monitor at all times, except when you are in water:  Do not get the monitor wet.  Take the monitor off when bathing. Do not swim or use a hot tub with it on.  Keep your skin clean. Do not put body lotion or moisturizer on your chest.  Change the electrodes daily or any time they stop sticking to your skin. You might need to use tape to keep them on.  It is possible that your skin under the electrodes could become irritated. To keep this from happening, try to put the electrodes in slightly different places on your chest. However, they must remain in the area under your left breast and in the upper right section of your chest.  Make sure the monitor is safely clipped to your clothing or in a location close to your body that your health care provider recommends.  Press the button to record when you feel symptoms of heart trouble, such as  dizziness, weakness, light-headedness, palpitations, thumping, shortness of breath, unexplained weakness, or a fluttering or racing heart. The monitor is always on and records what happened slightly before you pressed the button, so do not worry about being too late to get good information.  Keep a diary of your activities, such as walking, doing chores, and taking medicine. It is especially important to note what you were doing when you pushed the button to record your symptoms. This will help your health care provider determine what might be contributing to your symptoms. The information stored in your monitor will be reviewed by your health care provider alongside your diary entries.  Send the recorded information as recommended by your health care provider. It is important to understand that it will take some time for your health care provider to process the results.  Change the batteries as recommended by your health care provider. SEEK IMMEDIATE MEDICAL CARE IF:   You have chest pain.  You have extreme difficulty breathing or shortness of breath.  You develop a very fast heartbeat that persists.  You develop dizziness that does not go away.  You faint or constantly feel you are about to faint. Document Released: 04/05/2008 Document Revised: 11/11/2013 Document Reviewed: 12/24/2012 ExitCare Patient Information 2015 [employer]. This information is not intended to replace advice given to you by your health care provider. Make sure you discuss any questions you have with your health care provider.  

## 2015-02-06 ENCOUNTER — Ambulatory Visit (INDEPENDENT_AMBULATORY_CARE_PROVIDER_SITE_OTHER): Payer: Managed Care, Other (non HMO)

## 2015-02-06 DIAGNOSIS — R002 Palpitations: Secondary | ICD-10-CM

## 2016-10-24 ENCOUNTER — Ambulatory Visit (INDEPENDENT_AMBULATORY_CARE_PROVIDER_SITE_OTHER): Payer: Managed Care, Other (non HMO)

## 2016-10-24 ENCOUNTER — Ambulatory Visit (INDEPENDENT_AMBULATORY_CARE_PROVIDER_SITE_OTHER): Payer: Managed Care, Other (non HMO) | Admitting: Urgent Care

## 2016-10-24 VITALS — BP 110/73 | HR 72 | Temp 98.5°F | Resp 17 | Ht 62.75 in | Wt 116.0 lb

## 2016-10-24 DIAGNOSIS — S0083XA Contusion of other part of head, initial encounter: Secondary | ICD-10-CM

## 2016-10-24 DIAGNOSIS — S0990XA Unspecified injury of head, initial encounter: Secondary | ICD-10-CM

## 2016-10-24 DIAGNOSIS — R519 Headache, unspecified: Secondary | ICD-10-CM

## 2016-10-24 DIAGNOSIS — R51 Headache: Secondary | ICD-10-CM

## 2016-10-24 MED ORDER — NAPROXEN SODIUM 550 MG PO TABS: 550.0000 mg | ORAL_TABLET | Freq: Two times a day (BID) | ORAL | 1 refills | Status: AC

## 2016-10-24 NOTE — Progress Notes (Signed)
MRN: [id number] DOB: Apr 29, 1976  Subjective:   Tina Sawyer is a 41 y.o. female presenting for chief complaint of Fall (stepped on rake handle and it popped up and hit her in head)  Reports 2 day history of suffering a head injury. Patient stepped on a rake, the rake swung forward and hit patient over left frontal temporal head. She was immediately dizzy, had headache. Dizziness resolved fairly quickly but she has had persistent headache despite using ibuprofen. Admits that her headache is superficial as well as behind her eye. Head pain can be elicited with jaw movement. She has also had some left eye pressure and intermittent tinnitus. Denies loss of consciousness, blurred vision, difficulty with balance, memory, falls, n/v, neck pain, neck stiffness. She does not hydrate well. Works as a Runner, broadcasting/film/video for [employer] and has been working without any restrictions. Denies smoking cigarettes.  Tina Sawyer currently has no medications in their medication list. Also is allergic to latex and amoxicillin. Tina Sawyer  has a past medical history of Asthma and GERD (gastroesophageal reflux disease). Also  has a past surgical history that includes Cesarean section (2009); Wisdom tooth extraction; and Cesarean section (05/20/2011).  Objective:   Vitals: BP 110/73 (BP Location: Right Arm, Patient Position: Sitting, Cuff Size: Normal)   Pulse 72   Temp 98.5 F (36.9 C) (Oral)   Resp 17   Ht 5' 2.75" (1.594 m)   Wt 116 lb (52.6 kg)   LMP 09/29/2016 (Approximate)   SpO2 100%   BMI 20.71 kg/m   Physical Exam  Constitutional: She is oriented to person, place, and time. She appears well-developed and well-nourished.  HENT:  TM's intact bilaterally, no effusions or erythema. Weber and Rinne show AC > BC and no lateralization. Nasal turbinates pink and moist, nasal passages patent. No sinus tenderness. Oropharynx clear, mucous membranes moist, dentition in good repair.  Eyes: EOM are normal. Pupils are equal, round,  and reactive to light. Right eye exhibits no discharge. Left eye exhibits no discharge.  Neck: Normal range of motion. Neck supple.  Cardiovascular: Normal rate, regular rhythm and intact distal pulses.  Exam reveals no gallop and no friction rub.   No murmur heard. Pulmonary/Chest: No respiratory distress. She has no wheezes. She has no rales.  Musculoskeletal:  Strength 5/5.  Neurological: She is alert and oriented to person, place, and time. She displays normal reflexes. No cranial nerve deficit. Coordination normal.  Negative Romberg, heel-to-shin.  Skin: Skin is warm and dry. Capillary refill takes less than 2 seconds.  Psychiatric: She has a normal mood and affect.   Dg Skull Complete  Result Date: 10/24/2016 CLINICAL DATA:  Status post injury to the head.  Initial encounter. EXAM: SKULL - COMPLETE 4 + VIEW COMPARISON:  None. FINDINGS: There is no evidence of fracture or dislocation. The bony orbits are grossly unremarkable. The paranasal sinuses and mastoid air cells are well-aerated. The mandible appears intact. No definite soft tissue abnormalities are characterized on radiograph. IMPRESSION: No evidence of fracture or dislocation. Electronically Signed   By: Roanna Raider M.D.   On: 10/24/2016 18:10     Assessment and Plan :   1. Injury of head, initial encounter 2. Acute nonintractable headache, unspecified headache type 3. Contusion of face, initial encounter - Counseled on warning signs of post-concussion syndrome, brain rest. I do not suspect that patient suffered a concussion but she verbalized understanding risks, protocol for management of concussion. Physical exam findings are very reassuring. For now, patient will hydrate well,  use Anaprox. Return-to-clinic precautions discussed, patient verbalized understanding.   Wallis Bamberg, PA-C [facility] at [facility] [phone] 10/24/2016  5:59 PM

## 2016-10-24 NOTE — Patient Instructions (Addendum)
Facial or Scalp Contusion A facial or scalp contusion is a deep bruise (contusion) on the face or head. Injuries to the face and head generally cause a lot of swelling, especially around the eyes. Contusions are the result of an injury that caused bleeding under the skin. The contusion may turn blue, purple, or yellow. Minor injuries will give you a painless contusion, but more severe contusions may stay painful and swollen for a few weeks. What are the causes? A facial or scalp contusion is caused by a blunt injury, fall, or trauma to the face or head area. What are the signs or symptoms? Symptoms of this condition include:  Swelling of the injured area.  Discoloration of the injured area.  Tenderness, soreness, or pain in the injured area. How is this diagnosed? This condition is diagnosed based on your medical history and a physical exam. An X-ray exam, CT scan, or MRI may be needed to check for any additional injuries, such as broken bones (fractures). How is this treated? Often, the best treatment for a facial or scalp contusion is applying cold compresses to the injured area. Over-the-counter medicines may also be recommended for pain control. Follow these instructions at home:  Take over-the-counter and prescription medicines only as told by your health care provider.  If directed, apply ice to the injured area.  Put ice in a plastic bag.  Place a towel between your skin and the bag.  Leave the ice on for 20 minutes, 2-3 times a day.  Keep all follow-up visits as told by your health care provider. This is important. Contact a health care provider if:  You have trouble biting or chewing.  Your pain or swelling gets worse.  You have pain when you move your eyes. Get help right away if:  You have severe pain or a headache that is not relieved by medicine.  You have unusual sleepiness, confusion, or personality changes.  You vomit.  You have a nosebleed that does not  stop.  You have double vision or blurred vision.  You have a continuous clear fluid draining from your nose or ear.  You have trouble walking or using your arms or legs.  You have severe dizziness. Summary  A facial or scalp contusion is a deep bruise (contusion) on the face or head.  Contusions are the result of an injury that caused bleeding under the skin.  Minor injuries will give you a painless contusion, but more severe contusions may stay painful and swollen for a few weeks.  Often, the best treatment for a facial or scalp contusion is applying cold compresses to the injured area. This information is not intended to replace advice given to you by your health care provider. Make sure you discuss any questions you have with your health care provider. Document Released: 08/04/2004 Document Revised: 05/17/2016 Document Reviewed: 05/17/2016 Elsevier Interactive Patient Education  2017 [employer]    Post-Concussion Syndrome Post-concussion syndrome describes the symptoms that can occur after a head injury. These symptoms can last from weeks to months. What are the causes? It is not clear why some head injuries cause post-concussion syndrome. It can occur whether your head injury was mild or severe and whether you were wearing head protection or not. What are the signs or symptoms?  Memory difficulties.  Dizziness.  Headaches.  Double vision or blurry vision.  Sensitivity to light.  Hearing difficulties.  Depression.  Tiredness.  Weakness.  Difficulty with concentration.  Difficulty sleeping or staying  asleep.  Vomiting.  Poor balance or instability on your feet.  Slow reaction time.  Difficulty learning and remembering things you have heard. How is this diagnosed? There is no test to determine whether you have post-concussion syndrome. Your health care provider may order an imaging scan of your brain, such as a CT scan, to check for other problems that  may be causing your symptoms (such as a severe injury inside your skull). How is this treated? Usually, these problems disappear over time without medical care. Your health care provider may prescribe medicine to help ease your symptoms. It is important to follow up with a neurologist to evaluate your recovery and address any lingering symptoms or issues. Follow these instructions at home:  Take medicines only as directed by your health care provider. Do not take aspirin. Aspirin can slow blood clotting.  Sleep with your head slightly elevated to help with headaches.  Avoid any situation where there is potential for another head injury. This includes football, hockey, soccer, basketball, martial arts, downhill snow sports, and horseback riding. Your condition will get worse every time you experience a concussion. You should avoid these activities until you are evaluated by the appropriate follow-up health care providers.  Keep all follow-up visits as directed by your health care provider. This is important. Contact a health care provider if:  You have increased problems paying attention or concentrating.  You have increased difficulty remembering or learning new information.  You need more time to complete tasks or assignments than before.  You have increased irritability or decreased ability to cope with stress.  You have more symptoms than before. Seek medical care if you have any of the following symptoms for more than two weeks after your injury:  Lasting (chronic) headaches.  Dizziness or balance problems.  Nausea.  Vision problems.  Increased sensitivity to noise or light.  Depression or mood swings.  Anxiety or irritability.  Memory problems.  Difficulty concentrating or paying attention.  Sleep problems.  Feeling tired all the time. Get help right away if:  You have confusion or unusual drowsiness.  Others find it difficult to wake you up.  You have nausea  or persistent, forceful vomiting.  You feel like you are moving when you are not (vertigo). Your eyes may move rapidly back and forth.  You have convulsions or faint.  You have severe, persistent headaches that are not relieved by medicine.  You cannot use your arms or legs normally.  One of your pupils is larger than the other.  You have clear or bloody discharge from your nose or ears.  Your problems are getting worse, not better. This information is not intended to replace advice given to you by your health care provider. Make sure you discuss any questions you have with your health care provider. Document Released: 12/17/2001 Document Revised: 01/15/2016 Document Reviewed: 10/02/2013 Elsevier Interactive Patient Education  2017 [employer].     IF you received an x-ray today, you will receive an invoice from [facility]. Please contact [facility] at [phone] with questions or concerns regarding your invoice.   IF you received labwork today, you will receive an invoice from [facility]. Please contact [facility] at [phone] with questions or concerns regarding your invoice.   Our billing staff will not be able to assist you with questions regarding bills from these companies.  You will be contacted with the lab results as soon as they are available. The fastest way to get your results is  to activate your My Chart account. Instructions are located on the last page of this paperwork. If you have not heard from [place] regarding the results in 2 weeks, please contact this office.

## 2016-11-08 ENCOUNTER — Ambulatory Visit: Payer: Managed Care, Other (non HMO) | Admitting: Urgent Care

## 2017-07-13 ENCOUNTER — Encounter: Payer: Self-pay | Admitting: Physician Assistant

## 2017-07-13 ENCOUNTER — Ambulatory Visit (INDEPENDENT_AMBULATORY_CARE_PROVIDER_SITE_OTHER): Payer: Managed Care, Other (non HMO) | Admitting: Physician Assistant

## 2017-07-13 VITALS — BP 116/70 | HR 83 | Temp 98.1°F | Resp 16 | Ht 62.0 in | Wt 117.0 lb

## 2017-07-13 DIAGNOSIS — Z1329 Encounter for screening for other suspected endocrine disorder: Secondary | ICD-10-CM

## 2017-07-13 DIAGNOSIS — Z13228 Encounter for screening for other metabolic disorders: Secondary | ICD-10-CM

## 2017-07-13 DIAGNOSIS — F411 Generalized anxiety disorder: Secondary | ICD-10-CM

## 2017-07-13 DIAGNOSIS — R9431 Abnormal electrocardiogram [ECG] [EKG]: Secondary | ICD-10-CM

## 2017-07-13 DIAGNOSIS — Z0000 Encounter for general adult medical examination without abnormal findings: Secondary | ICD-10-CM

## 2017-07-13 DIAGNOSIS — Z130 Encounter for screening for diseases of the blood and blood-forming organs and certain disorders involving the immune mechanism: Secondary | ICD-10-CM

## 2017-07-13 NOTE — Progress Notes (Signed)
PRIMARY CARE AT [place], [place] [address] [phone]  Date:  07/13/2017   Name:  Tina Sawyer   DOB:  04-Sep-1975   MRN:  [id number]  PCP:  Lennie Odor, PA-C    History of Present Illness:  Tina Sawyer is a 42 y.o. female patient who presents to PCP with  Chief Complaint  Patient presents with  . Establish Care    pt has has hx of heart palpations which her former doc, was [place], but she moved. pt is fine today     DIET: eat everything.  Rare fastfoods.  Eats at home.  She gets vegetables in every day.  Water intake is 2 cups per day.  Caffeine: 4 cups of coffee, black tea.    BM: normal.  No blood or black stool  URINATION: normal.  No dysuria, hematuria, or frequency  SLEEP: 5 hours.  Wakes up a lot during sleep.  No night terrors.  Root tea.    SOCIAL ACTIVITY: she does not have a lot of socializing time with friends.  Birthday parties and trips with kids.  Kids and husband, and niece.   She feels safe time EtOH: 5-6 glasses of wine per day Tobacco or vaping: none Illicit drug use: none Sexually active: no dyspareunia or issues with that.    She has a history of palpitations, this was followed by cardiology.  She was placed on a holter monitor. With history had what was possibly a panic attack.  She does continue to have the palpitations.  She was seeing Animal nutritionist physician.   Children 9,6 and an international student who is 40 years old.    Patient Active Problem List   Diagnosis Date Noted  . Postpartum care following cesarean delivery (11/9) 05/20/2011    Past Medical History:  Diagnosis Date  . Asthma    as child-exercise induced  . GERD (gastroesophageal reflux disease)    with pregnancy-no meds    Past Surgical History:  Procedure Laterality Date  . CESAREAN SECTION  2009  . CESAREAN SECTION  05/20/2011   Procedure: CESAREAN SECTION;  Surgeon: Elveria Royals;  Location: [facility];  Service: Gynecology;  Laterality: N/A;  Repeat  .  WISDOM TOOTH EXTRACTION      Social History   Tobacco Use  . Smoking status: Former Smoker    Last attempt to quit: 05/17/2001    Years since quitting: 16.1  . Smokeless tobacco: Never Used  Substance Use Topics  . Alcohol use: Yes  . Drug use: No    History reviewed. No pertinent family history.  Allergies  Allergen Reactions  . Latex   . Amoxicillin Diarrhea and Nausea Only    Had reaction with po dosage, has taken penicillin since without problems    Medication list has been reviewed and updated.  Current Outpatient Medications on File Prior to Visit  Medication Sig Dispense Refill  . naproxen sodium (ANAPROX DS) 550 MG tablet Take 1 tablet (550 mg total) by mouth 2 (two) times daily with a meal. (Patient not taking: Reported on 07/13/2017) 30 tablet 1   No current facility-administered medications on file prior to visit.     Review of Systems  Constitutional: Negative for chills and fever.  HENT: Negative for ear discharge, ear pain and sore throat.   Eyes: Negative for blurred vision and double vision.  Respiratory: Negative for cough, shortness of breath and wheezing.   Cardiovascular: Positive for palpitations. Negative for chest pain and  leg swelling.  Gastrointestinal: Negative for diarrhea, nausea and vomiting.  Genitourinary: Negative for dysuria, frequency and hematuria.  Skin: Negative for itching and rash.  Neurological: Negative for dizziness and headaches.   ROS otherwise unremarkable unless listed above.  Physical Examination: BP 116/70   Pulse 83   Temp 98.1 F (36.7 C) (Oral)   Resp 16   Ht '5\' 2"'$  (1.575 m)   Wt 117 lb (53.1 kg)   LMP 05/30/2017   SpO2 100%   BMI 21.40 kg/m  Ideal Body Weight: Weight in (lb) to have BMI = 25: 136.4  Physical Exam  Constitutional: She is oriented to person, place, and time. She appears well-developed and well-nourished. No distress.  HENT:  Head: Normocephalic and atraumatic.  Right Ear: Tympanic membrane,  external ear and ear canal normal.  Left Ear: Tympanic membrane, external ear and ear canal normal.  Nose: Right sinus exhibits no maxillary sinus tenderness and no frontal sinus tenderness. Left sinus exhibits no maxillary sinus tenderness and no frontal sinus tenderness.  Mouth/Throat: Oropharynx is clear and moist. No uvula swelling. No oropharyngeal exudate, posterior oropharyngeal edema or posterior oropharyngeal erythema.  Eyes: Conjunctivae and EOM are normal. Pupils are equal, round, and reactive to light.  Neck: Normal range of motion. Neck supple. No thyromegaly present.  Cardiovascular: Normal rate, regular rhythm, normal heart sounds and intact distal pulses. Exam reveals no gallop, no distant heart sounds and no friction rub.  No murmur heard. Pulmonary/Chest: Effort normal and breath sounds normal. No respiratory distress. She has no decreased breath sounds. She has no wheezes. She has no rhonchi.  Abdominal: Soft. Bowel sounds are normal. She exhibits no distension and no mass. There is no tenderness.  Musculoskeletal: Normal range of motion. She exhibits no edema or tenderness.  Lymphadenopathy:       Head (right side): No submandibular, no tonsillar, no preauricular and no posterior auricular adenopathy present.       Head (left side): No submandibular, no tonsillar, no preauricular and no posterior auricular adenopathy present.    She has no cervical adenopathy.  Neurological: She is alert and oriented to person, place, and time. No cranial nerve deficit. She exhibits normal muscle tone. Coordination normal.  Skin: Skin is warm and dry. She is not diaphoretic.  Psychiatric: She has a normal mood and affect. Her behavior is normal.     Assessment and Plan: [name] is a 42 y.o. female who is here today for cc of  Chief Complaint  Patient presents with  . Establish Care    pt has has hx of heart palpations which her former doc, was [place], but she moved. pt is fine  today   Annual physical exam - Plan: CMP14+EGFR, CBC, TSH, EKG 12-Lead  Screening for metabolic disorder - Plan: CMP14+EGFR  Screening for deficiency anemia - Plan: CBC  Screening for thyroid disorder - Plan: TSH  Nonspecific abnormal electrocardiogram (ECG) (EKG) - Plan: EKG 12-Lead  Ivar Drape, PA-C [facility] 1/15/20198:24 AM

## 2017-07-13 NOTE — Patient Instructions (Addendum)
I will contact you in regards to your labs via MyChart. Please await contact for imaging. It was very nice to meet you. I would try melatonin 3-5 mg nightly.  [facility] [address] [place], [place] [address]   Shanon RosserBarbara Farran [phone]  Maris BergerKathy Kirstner [phone]  Marco CollieSusan Kroll-Smith [phone]   [facility] ([address]eth Hulan AmatoKincaid, Ernest [place], Herbert SetaHeather Joycelyn SchmidKitchens, Karla [place]) - [phone]  [facility](Julie [place]) - [phone]  [facility] - [phone]  [facility] - [phone]  Buena IrishBob Mylan - [phone]  [facility]  - [phone] (do not file insurance)  Keeping You Healthy  Get These Tests 1. Blood Pressure- Have your blood pressure checked once a year by your health care provider.  Normal blood pressure is 120/80. 2. Weight- Have your body mass index (BMI) calculated to screen for obesity.  BMI is measure of body fat based on height and weight.  You can also calculate your own BMI at https://www.west-esparza.com/www.nhlbisupport.com/bmi/. 3. Cholesterol- Have your cholesterol checked every 5 years starting at age 40 then yearly starting at age 40. 4. Chlamydia, HIV, and other sexually transmitted diseases- Get screened every year until age 40, then within three months of each new sexual provider. 5. Pap Test - Every 1-5 years; discuss with your health care provider. 6. Mammogram- Every 1-2 years starting at age 40--50  Take these medicines  Calcium with Vitamin D-Your body needs 1200 mg of Calcium each day and [id number] IU of Vitamin D daily.  Your body can only absorb 500 mg of Calcium at a time so Calcium must be taken in 2 or 3 divided doses throughout the day.  Multivitamin with folic acid- Once daily if it is possible for you to become pregnant.  Get these Immunizations  Gardasil-Series of three doses; prevents HPV related illness such as genital warts and  cervical cancer.  Menactra-Single dose; prevents meningitis.  Tetanus shot- Every 10 years.  Flu shot-Every year.  Take these steps 1. Do not smoke-Your healthcare provider can help you quit.  For tips on how to quit go to www.smokefree.gov or call 1-800 QUITNOW. 2. Be physically active- Exercise 5 days a week for at least 30 minutes.  If you are not already physically active, start slow and gradually work up to 30 minutes of moderate physical activity.  Examples of moderate activity include walking briskly, dancing, swimming, bicycling, etc. 3. Breast Cancer- A self breast exam every month is important for early detection of breast cancer.  For more information and instruction on self breast exams, ask your healthcare provider or [place].eswww.womenshealth.gov/faq/breast-self-exam.cfm. 4. Eat a healthy diet- Eat a variety of healthy foods such as fruits, vegetables, whole grains, low fat milk, low fat cheeses, yogurt, lean meats, poultry and fish, beans, nuts, tofu, etc.  For more information go to www. Thenutritionsource.org 5. Drink alcohol in moderation- Limit alcohol intake to one drink or less per day. Never drink and drive. 6. Depression- Your emotional health is as important as your physical health.  If you're feeling down or losing interest in things you normally enjoy please talk to your healthcare provider about being screened for depression. 7. Dental visit- Brush and floss your teeth twice daily; visit your dentist twice a year. 8. Eye doctor- Get an eye exam at least every 2 years. 9. Helmet use- Always wear a helmet when riding a bicycle, motorcycle, rollerblading or skateboarding. 10. Safe sex- If you may be exposed to sexually transmitted infections,  use a condom. 11. Seat belts- Seat belts can save your live; always wear one. 12. Smoke/Carbon Monoxide detectors- These detectors need to be installed on the appropriate level of your home. Replace batteries at least once a year. 13. Skin  cancer- When out in the sun please cover up and use sunscreen 15 SPF or higher. 14. Violence- If anyone is threatening or hurting you, please tell your healthcare provider.          IF you received an x-ray today, you will receive an invoice from [facility]. Please contact [facility] at [phone] with questions or concerns regarding your invoice.   IF you received labwork today, you will receive an invoice from [facility]. Please contact [facility] at [phone] with questions or concerns regarding your invoice.   Our billing staff will not be able to assist you with questions regarding bills from these companies.  You will be contacted with the lab results as soon as they are available. The fastest way to get your results is to activate your My Chart account. Instructions are located on the last page of this paperwork. If you have not heard from [place] regarding the results in 2 weeks, please contact this office.

## 2017-07-14 LAB — CMP14+EGFR
A/G RATIO: 2.4 — AB (ref 1.2–2.2)
ALT: 12 IU/L (ref 0–32)
AST: 14 IU/L (ref 0–40)
Albumin: 4.6 g/dL (ref 3.5–5.5)
Alkaline Phosphatase: 45 IU/L (ref 39–117)
BILIRUBIN TOTAL: 0.8 mg/dL (ref 0.0–1.2)
BUN/Creatinine Ratio: 18 (ref 9–23)
BUN: 13 mg/dL (ref 6–20)
CALCIUM: 9.2 mg/dL (ref 8.7–10.2)
CHLORIDE: 102 mmol/L (ref 96–106)
CO2: 24 mmol/L (ref 20–29)
Creatinine, Ser: 0.72 mg/dL (ref 0.57–1.00)
GFR calc non Af Amer: 106 mL/min/{1.73_m2} (ref >59)
GFR, EST AFRICAN AMERICAN: 122 mL/min/{1.73_m2} (ref >59)
Globulin, Total: 1.9 g/dL (ref 1.5–4.5)
Glucose: 118 mg/dL — ABNORMAL HIGH (ref 65–99)
POTASSIUM: 4.3 mmol/L (ref 3.5–5.2)
Sodium: 141 mmol/L (ref 134–144)
TOTAL PROTEIN: 6.5 g/dL (ref 6.0–8.5)

## 2017-07-14 LAB — CBC
HEMOGLOBIN: 12.6 g/dL (ref 11.1–15.9)
Hematocrit: 39.1 % (ref 34.0–46.6)
MCH: 29.3 pg (ref 26.6–33.0)
MCHC: 32.2 g/dL (ref 31.5–35.7)
MCV: 91 fL (ref 79–97)
Platelets: 333 10*3/uL (ref 150–379)
RBC: 4.3 x10E6/uL (ref 3.77–5.28)
RDW: 13.1 % (ref 12.3–15.4)
WBC: 7 10*3/uL (ref 3.4–10.8)

## 2017-07-14 LAB — TSH: TSH: 1.59 u[IU]/mL (ref 0.450–4.500)

## 2017-10-16 ENCOUNTER — Encounter: Payer: Self-pay | Admitting: Physician Assistant

## 2017-10-18 ENCOUNTER — Encounter: Payer: Self-pay | Admitting: Physician Assistant

## 2018-11-13 IMAGING — DX DG SKULL COMPLETE 4+V
4 series · 4 of 4 positions shown · non-contrast
Comparison: None.

CLINICAL DATA: Status post injury to the head.  Initial encounter.

EXAM:
SKULL - COMPLETE 4 + VIEW

[skull cal[place]]
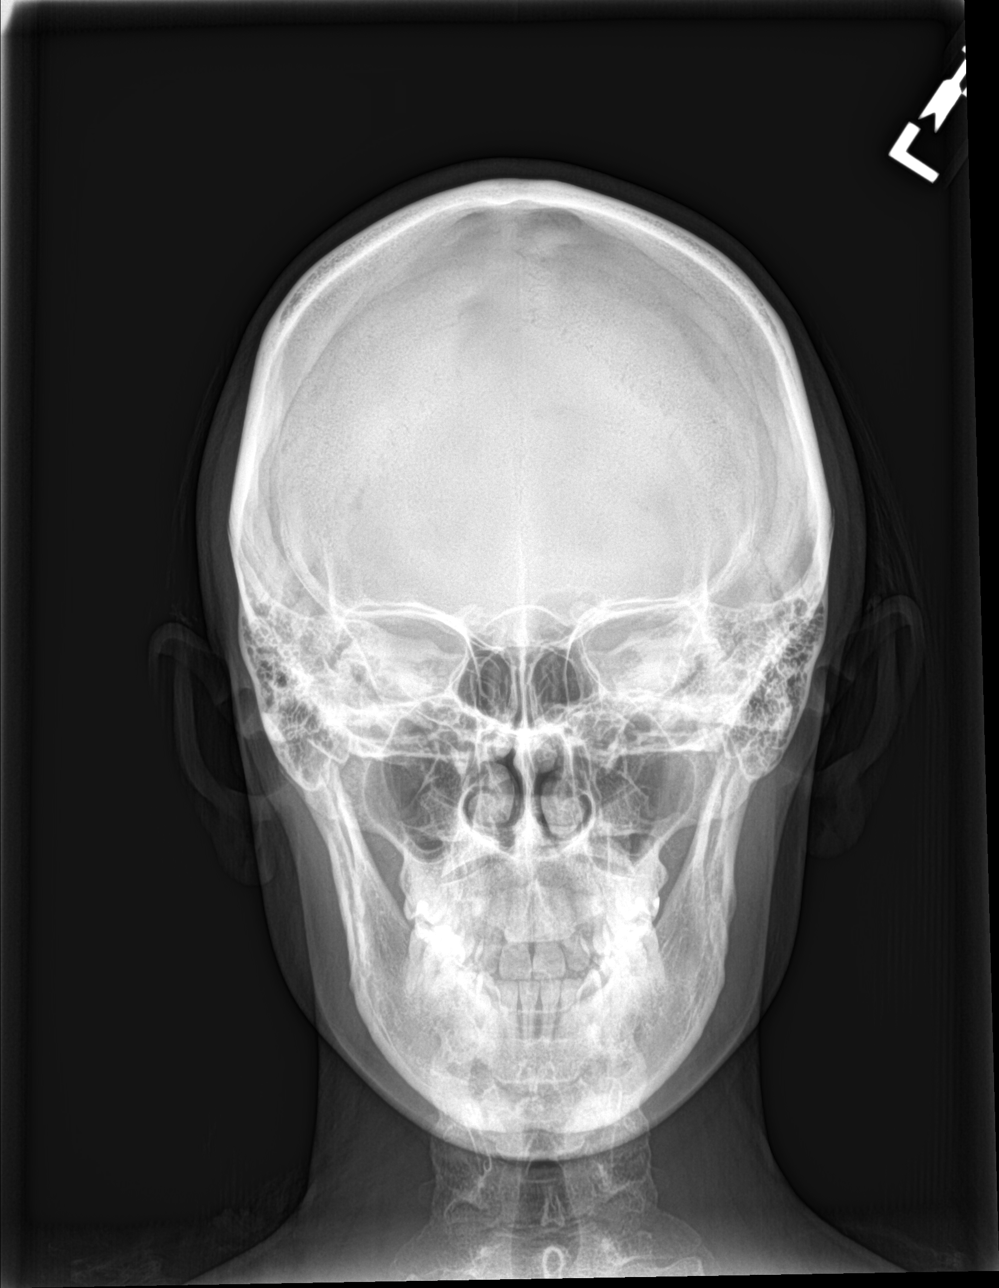

[skull pa]
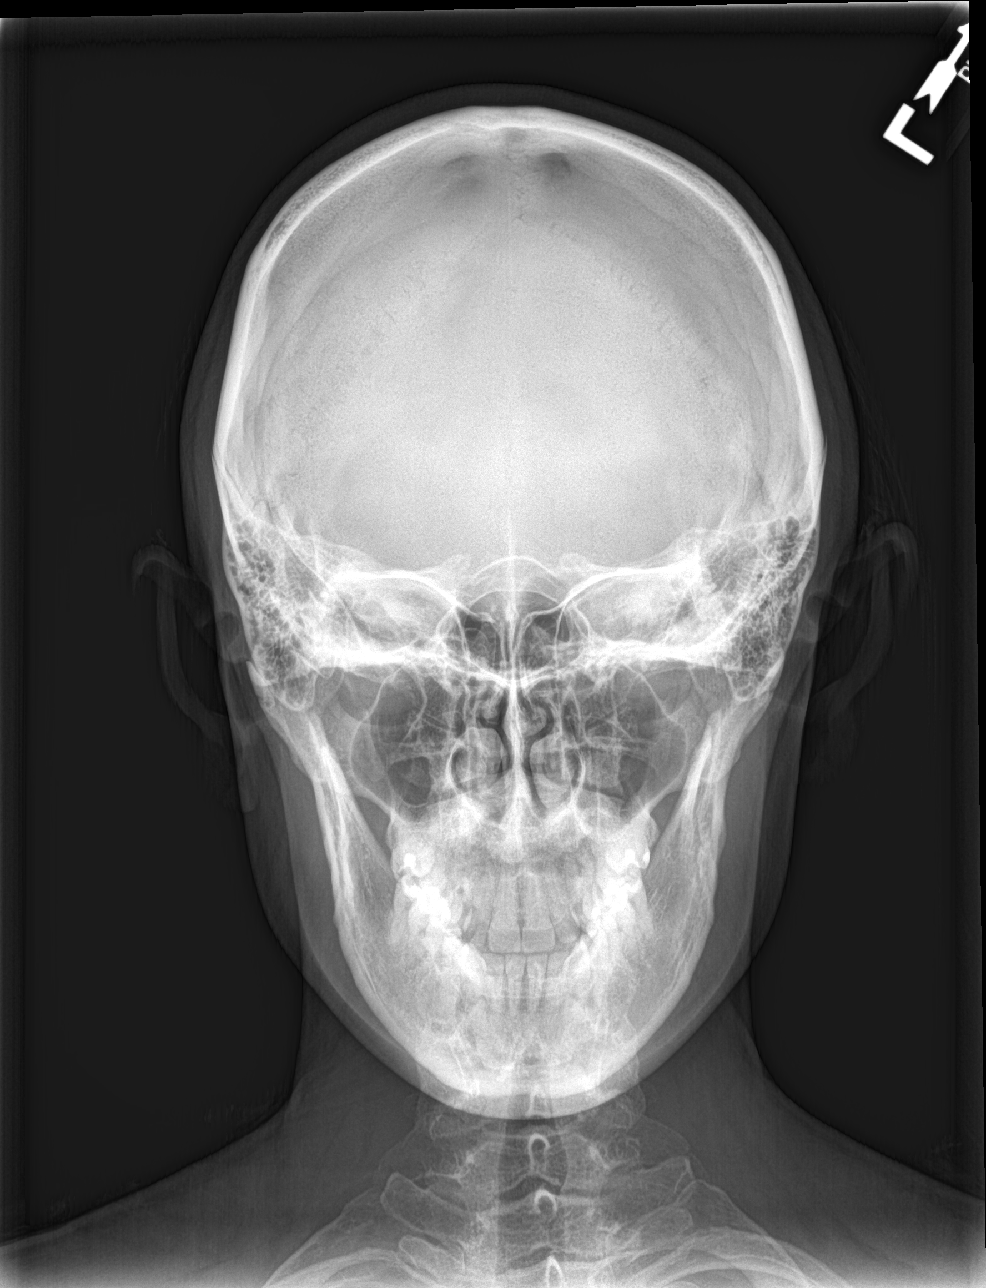

[skull towns]
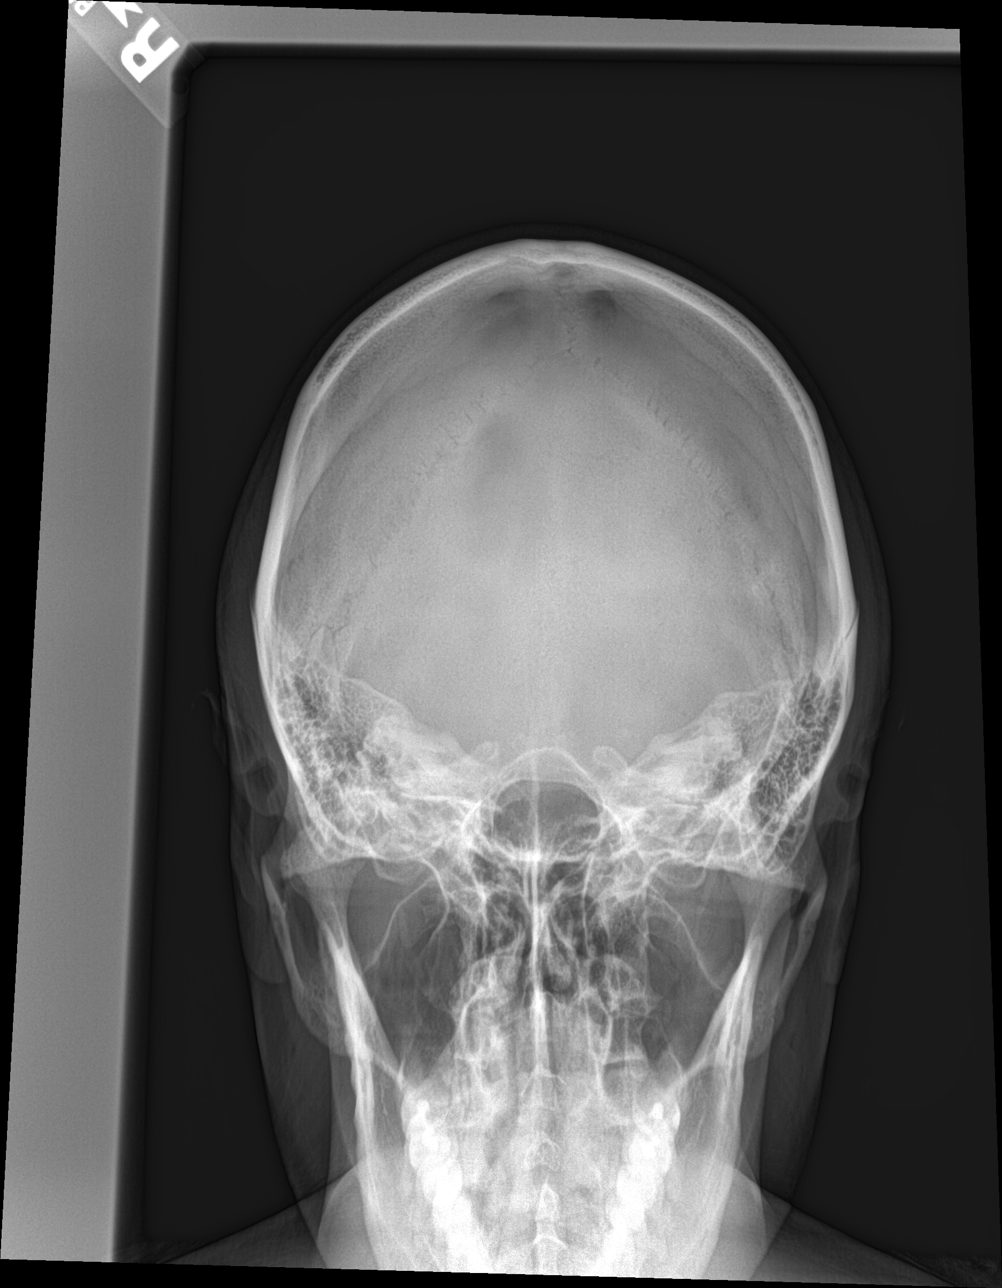

[skull lat]
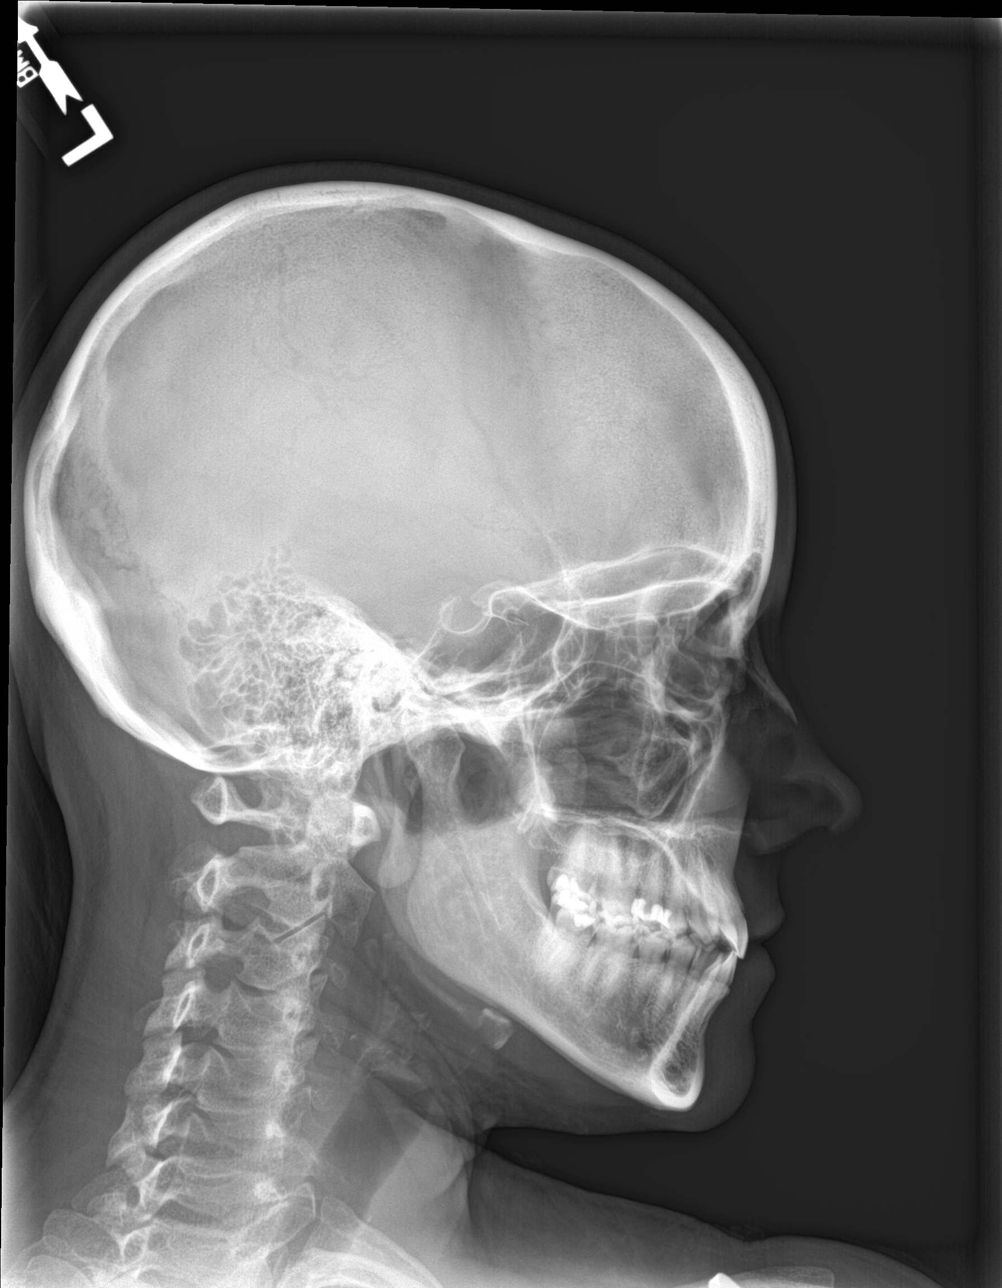

[4 of 4 positions shown; findings below may reference images not displayed]

FINDINGS: There is no evidence of fracture or dislocation. The bony orbits are
grossly unremarkable. The paranasal sinuses and mastoid air cells
are well-aerated. The mandible appears intact. No definite soft
tissue abnormalities are characterized on radiograph.
IMPRESSION: No evidence of fracture or dislocation.

## 2019-09-26 ENCOUNTER — Ambulatory Visit: Payer: Self-pay | Attending: Internal Medicine

## 2019-09-26 DIAGNOSIS — Z23 Encounter for immunization: Secondary | ICD-10-CM

## 2019-09-26 NOTE — Progress Notes (Signed)
   Covid-19 Vaccination Clinic  Name:  Tina Sawyer    MRN: [id number] DOB: 1975-10-16  09/26/2019  [name] was observed post Covid-19 immunization for 15 minutes without incident. She was provided with Vaccine Information Sheet and instruction to access the V-Safe system.   [name] was instructed to call 911 with any severe reactions post vaccine: Marland Kitchen Difficulty breathing  . Swelling of face and throat  . A fast heartbeat  . A bad rash all over body  . Dizziness and weakness   Immunizations Administered    Name Date Dose VIS Date Route   Pfizer COVID-19 Vaccine 09/26/2019 10:33 AM 0.3 mL 06/21/2019 Intramuscular   Manufacturer: [employer], [employer]   Lot: [id number]   NDC: [address]-7

## 2019-10-23 ENCOUNTER — Ambulatory Visit: Payer: Self-pay | Attending: Internal Medicine

## 2019-10-23 DIAGNOSIS — Z23 Encounter for immunization: Secondary | ICD-10-CM

## 2019-10-23 NOTE — Progress Notes (Signed)
   Covid-19 Vaccination Clinic  Name:  Tina Sawyer    MRN: [id number] DOB: 1976-06-18  10/23/2019  Ms. Allington was observed post Covid-19 immunization for 15 minutes without incident. She was provided with Vaccine Information Sheet and instruction to access the V-Safe system.   Ms. Fouche was instructed to call 911 with any severe reactions post vaccine: Marland Kitchen Difficulty breathing  . Swelling of face and throat  . A fast heartbeat  . A bad rash all over body  . Dizziness and weakness   Immunizations Administered    Name Date Dose VIS Date Route   Pfizer COVID-19 Vaccine 10/23/2019 10:39 AM 0.3 mL 06/21/2019 Intramuscular   Manufacturer: [employer], [employer]   Lot: [id number]   NDC: [address]-6

## 2020-02-20 DIAGNOSIS — F338 Other recurrent depressive disorders: Secondary | ICD-10-CM | POA: Diagnosis not present

## 2020-03-11 ENCOUNTER — Ambulatory Visit: Payer: Managed Care, Other (non HMO) | Admitting: Family Medicine

## 2020-05-01 DIAGNOSIS — R635 Abnormal weight gain: Secondary | ICD-10-CM | POA: Diagnosis not present

## 2020-05-01 DIAGNOSIS — F418 Other specified anxiety disorders: Secondary | ICD-10-CM | POA: Diagnosis not present

## 2020-05-01 DIAGNOSIS — Z23 Encounter for immunization: Secondary | ICD-10-CM | POA: Diagnosis not present

## 2020-06-16 ENCOUNTER — Ambulatory Visit: Payer: BC Managed Care – PPO | Attending: Internal Medicine

## 2020-06-16 DIAGNOSIS — Z23 Encounter for immunization: Secondary | ICD-10-CM

## 2020-06-16 NOTE — Progress Notes (Signed)
   Covid-19 Vaccination Clinic  Name:  Tina Sawyer    MRN: [id number] DOB: 01/15/1976  06/16/2020  Tina Sawyer was observed post Covid-19 immunization for 15 minutes without incident. She was provided with Vaccine Information Sheet and instruction to access the V-Safe system.   Tina Sawyer was instructed to call 911 with any severe reactions post vaccine: Marland Kitchen Difficulty breathing  . Swelling of face and throat  . A fast heartbeat  . A bad rash all over body  . Dizziness and weakness   Immunizations Administered    No immunizations on file.

## 2020-07-20 DIAGNOSIS — M8580 Other specified disorders of bone density and structure, unspecified site: Secondary | ICD-10-CM | POA: Diagnosis not present

## 2020-07-20 DIAGNOSIS — E301 Precocious puberty: Secondary | ICD-10-CM | POA: Diagnosis not present

## 2020-10-30 DIAGNOSIS — F418 Other specified anxiety disorders: Secondary | ICD-10-CM | POA: Diagnosis not present

## 2021-04-23 DIAGNOSIS — Z23 Encounter for immunization: Secondary | ICD-10-CM | POA: Diagnosis not present

## 2021-04-23 DIAGNOSIS — Z13220 Encounter for screening for lipoid disorders: Secondary | ICD-10-CM | POA: Diagnosis not present

## 2021-04-23 DIAGNOSIS — F418 Other specified anxiety disorders: Secondary | ICD-10-CM | POA: Diagnosis not present

## 2021-04-23 DIAGNOSIS — Z0000 Encounter for general adult medical examination without abnormal findings: Secondary | ICD-10-CM | POA: Diagnosis not present

## 2021-06-17 DIAGNOSIS — J0110 Acute frontal sinusitis, unspecified: Secondary | ICD-10-CM | POA: Diagnosis not present

## 2021-06-17 DIAGNOSIS — U071 COVID-19: Secondary | ICD-10-CM | POA: Diagnosis not present

## 2021-11-03 DIAGNOSIS — F418 Other specified anxiety disorders: Secondary | ICD-10-CM | POA: Diagnosis not present

## 2021-11-03 DIAGNOSIS — M545 Low back pain, unspecified: Secondary | ICD-10-CM | POA: Diagnosis not present

## 2023-06-06 LAB — COLOGUARD: COLOGUARD: NEGATIVE
